# Patient Record
Sex: Female | Born: 2000 | Race: White | Hispanic: No | Marital: Single | Smoking: Never smoker
Health system: Southern US, Community
[De-identification: ages and names within clinical notes are randomized; demographics above are authoritative.]

---
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
## 2021-09-12 ENCOUNTER — Ambulatory Visit: Payer: Self-pay | Admitting: Gastroenterology

## 2021-09-14 ENCOUNTER — Ambulatory Visit (INDEPENDENT_AMBULATORY_CARE_PROVIDER_SITE_OTHER): Payer: BC Managed Care – PPO | Admitting: Gastroenterology

## 2021-09-14 ENCOUNTER — Encounter: Payer: Self-pay | Admitting: Gastroenterology

## 2021-09-14 VITALS — BP 125/86 | HR 80 | Temp 98.7°F | Wt 247.0 lb

## 2021-09-14 DIAGNOSIS — K219 Gastro-esophageal reflux disease without esophagitis: Secondary | ICD-10-CM

## 2021-09-14 NOTE — Progress Notes (Signed)
Leah Brooks [address]  [address]  [place], [place] [address]  Main: [phone]  Fax: [phone]   Gastroenterology Consultation  Referring Provider:     Bronson Ing, MD Primary Care Physician:  No primary care provider on file. Reason for Consultation:     GERD        HPI:    Chief complaint: Reflux  Leah Brooks is a 20 y.o. y/o female referred for consultation & management  by Dr. Baxter Brooks page.  Patient presents with her mother who made this appointment as patient has been taking Pepcid for 1 year and they state that PCP wanted to see if it is okay to continue these medications or if she needs further evaluation.  Patient states that she started taking the medication about a year ago, she started having heartburn, especially when she started drinking alcohol.  Since symptoms are occurring frequently at that time, she has remained on Pepcid since then, but as of recently has made diet and lifestyle changes and even when she forgets 1 or 2 doses of Pepcid, she does not have any symptoms of heartburn during those days.  She has never had dysphagia.  No nausea or vomiting.  No altered bowel habits or blood in stool.  No unintended weight loss  She is currently on antibiotics and spironolactone by other providers  Past medical history: None    Prior to Admission medications   Medication Sig Start Date End Date Taking? Authorizing Provider  amphetamine-dextroamphetamine (ADDERALL XR) 25 MG 24 hr capsule Take by mouth daily. 08/25/21  Yes [provider]  amphetamine-dextroamphetamine (ADDERALL) 12.5 MG tablet Take 1 tablet by mouth daily. 06/01/21  Yes [provider]  metroNIDAZOLE (FLAGYL) 500 MG tablet Take 500 mg by mouth 2 (two) times daily. 09/13/21  Yes [provider]  spironolactone (ALDACTONE) 50 MG tablet Take 50 mg by mouth 2 (two) times daily. 09/12/21  Yes [provider]    Family History  Problem Relation  Age of Onset   Hypertension Father    High Cholesterol Father    Hypertension Maternal Grandmother    Hypertension Maternal Grandfather    Hypertension Paternal Grandmother    Hypertension Paternal Grandfather      Social History   Tobacco Use   Smoking status: Never   Smokeless tobacco: Never  Substance Use Topics   Alcohol use: Yes    Comment: occasional   Drug use: Never    Allergies as of 09/14/2021   (Not on File)    Review of Systems:    All systems reviewed and negative except where noted in HPI.   Physical Exam:  Constitutional: General:   Alert,  Well-developed, well-nourished, pleasant and cooperative in NAD BP 125/86    Pulse 80    Temp 98.7 F (37.1 C) (Oral)    Wt 247 lb (112 kg)   Eyes:  Sclera clear, no icterus.   Conjunctiva pink. PERRLA  Ears:  No scars, lesions or masses, Normal auditory acuity. Nose:  No deformity, discharge, or lesions. Mouth:  No deformity or lesions, oropharynx pink & moist.  Neck:  Supple; no masses or thyromegaly.  Respiratory: Normal respiratory effort, Normal percussion  Gastrointestinal: Soft, non-tender and non-distended without masses, hepatosplenomegaly or hernias noted.  No guarding or rebound tenderness.     Cardiac: No clubbing or edema.  No cyanosis. Normal posterior tibial pedal pulses noted.  Lymphatic:  No significant cervical or axillary adenopathy.  Psych:  Alert and cooperative. Normal mood and affect.  Musculoskeletal:  Normal gait. Head normocephalic, atraumatic. Symmetrical without gross deformities. 5/5 Upper and Lower extremity strength bilaterally.  Skin: Warm. Intact without significant lesions or rashes. No jaundice.  Neurologic:  Face symmetrical, tongue midline, Normal sensation to touch;  grossly normal neurologically.  Psych:  Alert and oriented x3, Alert and cooperative. Normal mood and affect.   Labs: CBC No results found for: WBC, RBC, HGB, HCT, PLT, MCV, MCH, MCHC, RDW, LYMPHSABS,  MONOABS, EOSABS, BASOSABS CMP  No results found for: NA, K, CL, CO2, GLUCOSE, BUN, CREATININE, CALCIUM, PROT, ALBUMIN, AST, ALT, ALKPHOS, BILITOT, GFRNONAA, GFRAA  Imaging Studies: No results found.  Assessment and Plan:   Leah Brooks is a 20 y.o. y/o female has been referred for GERD  Patient has been on Pepcid for over 1 year and has recently started making diet and lifestyle changes.  She is trying to avoid alcohol as that is her definite trigger for breakthrough symptoms.  Certain tomato-based foods also cause any symptoms for her and she does try to avoid these.  She is also trying not to eat for at least 3 hours before bedtime  She is not having any alarm symptoms at this time  We had an extensive discussion with the patient and family on options which would include conservative management versus endoscopic procedures.  Given that her symptoms have started to get better, they would prefer conservative management at this time which is reasonable.  I have recommended that they continue to lifestyle modifications that she has instituted and discussed others and given her for the same  Patient educated on anti-reflux lifestyle modification, including using a bed wedge, weight loss, not eating 3 hrs before bedtime, diet modifications, and handout given for the same.   Since she does not have any symptoms even when she misses 1-2 doses of Pepcid, she is likely to not need daily medications for reflux if she institutes antireflux lifestyle modifications as above.  I have advised her to continue Pepcid for 1 more month and then stop the medication completely after she institutes lifestyle measures, and see if symptoms return.  If symptoms return, EGD can be considered and her and her family were agreeable with the plan  We also discussed potential of using PPI for the next month instead of the Pepcid that she is on.  However, since she is already on antibiotics which puts her at risk of  getting C. difficile diarrhea, adding a PPI will put her at further risks of this, and since her symptoms are getting better, will avoid this risk at this time  Follow-up in 2 to 3 months or earlier if needed.  Patient encouraged to call us if symptoms change, or if she has any other questions prior to her next visit as well  Dr Leah Brooks  Speech recognition software was used to dictate the above note.

## 2021-09-14 NOTE — Patient Instructions (Signed)
Purchase a bed wedge pillow as shown below   Take Pepcid for 30 days and then stop.
